# Patient Record
Sex: Male | Born: 2000 | Race: Black or African American | Hispanic: No | Marital: Single | Smoking: Never smoker
Health system: Southern US, Community
[De-identification: ages and names within clinical notes are randomized; demographics above are authoritative.]

## PROBLEM LIST (undated history)

## (undated) DIAGNOSIS — H9192 Unspecified hearing loss, left ear: Secondary | ICD-10-CM

## (undated) DIAGNOSIS — F32A Depression, unspecified: Secondary | ICD-10-CM

## (undated) DIAGNOSIS — H9190 Unspecified hearing loss, unspecified ear: Secondary | ICD-10-CM

## (undated) DIAGNOSIS — Z8719 Personal history of other diseases of the digestive system: Secondary | ICD-10-CM

## (undated) DIAGNOSIS — F419 Anxiety disorder, unspecified: Secondary | ICD-10-CM

## (undated) DIAGNOSIS — K56609 Unspecified intestinal obstruction, unspecified as to partial versus complete obstruction: Secondary | ICD-10-CM

## (undated) DIAGNOSIS — J45909 Unspecified asthma, uncomplicated: Secondary | ICD-10-CM

## (undated) HISTORY — DX: Depression, unspecified: F32.A

## (undated) HISTORY — DX: Anxiety disorder, unspecified: F41.9

## (undated) HISTORY — DX: Unspecified hearing loss, unspecified ear: H91.90

## (undated) HISTORY — DX: Unspecified intestinal obstruction, unspecified as to partial versus complete obstruction: K56.609

## (undated) HISTORY — DX: Unspecified hearing loss, left ear: H91.92

## (undated) HISTORY — DX: Personal history of other diseases of the digestive system: Z87.19

## (undated) HISTORY — DX: Unspecified asthma, uncomplicated: J45.909

---
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
## 2021-03-11 ENCOUNTER — Emergency Department (HOSPITAL_COMMUNITY)
Admission: EM | Admit: 2021-03-11 | Discharge: 2021-03-12 | Disposition: A | Discharge status: Home / Self Care | Payer: 59 | Attending: Emergency Medicine | Admitting: Emergency Medicine

## 2021-03-11 DIAGNOSIS — N50812 Left testicular pain: Secondary | ICD-10-CM | POA: Diagnosis present

## 2021-03-11 DIAGNOSIS — Z20822 Contact with and (suspected) exposure to covid-19: Secondary | ICD-10-CM | POA: Diagnosis not present

## 2021-03-11 DIAGNOSIS — N50819 Testicular pain, unspecified: Secondary | ICD-10-CM

## 2021-03-11 DIAGNOSIS — I861 Scrotal varices: Secondary | ICD-10-CM

## 2021-03-11 DIAGNOSIS — R102 Pelvic and perineal pain: Secondary | ICD-10-CM | POA: Diagnosis not present

## 2021-03-12 ENCOUNTER — Emergency Department (HOSPITAL_COMMUNITY): Payer: 59

## 2021-03-12 DIAGNOSIS — N50812 Left testicular pain: Secondary | ICD-10-CM | POA: Diagnosis not present

## 2021-03-12 LAB — URINALYSIS, ROUTINE W REFLEX MICROSCOPIC
Bacteria, UA: NONE SEEN
Bilirubin Urine: NEGATIVE
Glucose, UA: NEGATIVE mg/dL
Hgb urine dipstick: NEGATIVE
Ketones, ur: NEGATIVE mg/dL
Nitrite: NEGATIVE
Protein, ur: NEGATIVE mg/dL
Specific Gravity, Urine: 1.028 (ref 1.005–1.030)
pH: 5 (ref 5.0–8.0)

## 2021-03-12 LAB — RESP PANEL BY RT-PCR (FLU A&B, COVID) ARPGX2
Influenza A by PCR: NEGATIVE
Influenza B by PCR: NEGATIVE
SARS Coronavirus 2 by RT PCR: NEGATIVE

## 2021-03-12 MED ORDER — MORPHINE SULFATE (PF) 4 MG/ML IV SOLN
4.0000 mg | Freq: Once | INTRAVENOUS | Status: AC
  Administered 2021-03-12: 4 mg via INTRAVENOUS
  Filled 2021-03-12: qty 1

## 2021-03-12 MED ORDER — OXYCODONE-ACETAMINOPHEN 5-325 MG PO TABS
1.0000 | ORAL_TABLET | Freq: Once | ORAL | Status: DC
  Filled 2021-03-12: qty 1

## 2021-03-12 MED ORDER — DIPHENHYDRAMINE HCL 50 MG/ML IJ SOLN
25.0000 mg | Freq: Once | INTRAMUSCULAR | Status: AC
  Administered 2021-03-12: 25 mg via INTRAVENOUS
  Filled 2021-03-12: qty 1

## 2021-03-12 NOTE — ED Notes (Signed)
Patient is back from CT.

## 2021-03-12 NOTE — ED Notes (Signed)
Ultrasound at bedside. Patient states he began itching after having the morphine. MD is aware.

## 2021-03-12 NOTE — Discharge Instructions (Signed)
Your evaluation did not show the cause for your pain.  There is no sign of testicular torsion.  There is a varicocele on the left side.  This is a collection of dilated veins.  Please follow-up with the urologist.  If pain comes back, you may take ibuprofen or naproxen.  If this does not give you sufficient relief, then return to the emergency department.

## 2021-03-12 NOTE — ED Provider Notes (Signed)
MSE was initiated and I personally evaluated the patient and placed orders (if any) at  12:00 AM on Mar 12, 2021.  Left testicle pain Sudden onset at 11:00 pm  Today's Vitals   03/11/21 2354  BP: (!) 118/91  Pulse: 65  Resp: 16  Temp: 98.3 F (36.8 C)  TempSrc: Oral  SpO2: 96%  Weight: 77.1 kg  Height: 6\' 2"  (1.88 m)   Body mass index is 21.83 kg/m.  Appears uncomfortable  The patient appears stable so that the remainder of the MSE may be completed by another provider.   , PA-C 03/12/21 0000    03/14/21, MD 03/12/21 [phone]

## 2021-03-12 NOTE — ED Provider Notes (Signed)
[facility]ERGENCY DEPT Provider Note   CSN: [id number] Arrival date & time: 03/11/21  2345     History Chief complaint: Left testicle pain  David Cuevas is a 20 y.o. male.  The history is provided by the patient.  He complains of sudden onset of severe pain in his left testicle at about 10 PM.  Pain is sharp and worse with any movement.  Nothing makes it better.  He denies any nausea or vomiting.  He denies fever or chills.  There is some radiation of pain to the suprapubic area but not to the flank.  He has not taken anything for pain.  Pain is rated at 10/10.   Past Medical History:  Diagnosis Date  . Deafness in left ear   . Hx SBO     Patient Active Problem List   Diagnosis Date Noted  . Hx SBO   . Deafness in left ear     No past surgical history on file.     No family history on file.  Social History   Tobacco Use  . Smoking status: Never Smoker  . Smokeless tobacco: Never Used  Substance Use Topics  . Alcohol use: No  . Drug use: No    Home Medications Prior to Admission medications   Not on File    Allergies    Patient has no known allergies.  Review of Systems   Review of Systems  All other systems reviewed and are negative.   Physical Exam Updated Vital Signs BP (!) 118/91 (BP Location: Right Arm)   Pulse 65   Temp 98.3 F (36.8 C) (Oral)   Resp 16   Ht 6\' 2"  (1.88 m)   Wt 77.1 kg   SpO2 96%   BMI 21.83 kg/m   Physical Exam Vitals and nursing note reviewed.   20 year old male, appears uncomfortable, but is in no acute distress. Vital signs are normal. Oxygen saturation is 96%, which is normal. Head is normocephalic and atraumatic. PERRLA, EOMI. Oropharynx is clear. Neck is nontender and supple without adenopathy or JVD. Back is nontender and there is no CVA tenderness. Lungs are clear without rales, wheezes, or rhonchi. Chest is nontender. Heart has regular rate and rhythm without murmur. Abdomen is  soft, flat, nontender without masses or hepatosplenomegaly and peristalsis is normoactive. Genitalia: Uncircumcised penis.  Testes are descended.  Left testicle is markedly tender but is not enlarged or indurated and is not high riding or transverse in position. Extremities have no cyanosis or edema, full range of motion is present. Skin is warm and dry without rash. Neurologic: Mental status is normal, cranial nerves are intact, there are no motor or sensory deficits.  ED Results / Procedures / Treatments   Labs (all labs ordered are listed, but only abnormal results are displayed) Labs Reviewed  URINALYSIS, ROUTINE W REFLEX MICROSCOPIC - Abnormal; Notable for the following components:      Result Value   Leukocytes,Ua TRACE (*)    All other components within normal limits  RESP PANEL BY RT-PCR (FLU A&B, COVID) [id number]   Radiology CT Renal Stone Study  Result Date: 03/12/2021 CLINICAL DATA:  Left testicular pain, left flank pain EXAM: CT ABDOMEN AND PELVIS WITHOUT CONTRAST TECHNIQUE: Multidetector CT imaging of the abdomen and pelvis was performed following the standard protocol without IV contrast. COMPARISON:  None. FINDINGS: Lower chest: No acute abnormality. Hepatobiliary: No focal liver abnormality is seen. No gallstones, gallbladder wall thickening, or biliary dilatation.  Pancreas: Unremarkable Spleen: A 3.7 cm cyst is seen within the upper pole of the spleen. The spleen is otherwise unremarkable. Adrenals/Urinary Tract: Adrenal glands are unremarkable. Kidneys are normal, without renal calculi, focal lesion, or hydronephrosis. Bladder is unremarkable. Stomach/Bowel: Stomach is within normal limits. Appendix appears normal. No evidence of bowel wall thickening, distention, or inflammatory changes. No free intraperitoneal gas or fluid. Vascular/Lymphatic: No significant vascular findings are present. No enlarged abdominal or pelvic lymph nodes. Reproductive: Prostate is unremarkable. Other:  No abdominal wall hernia. Musculoskeletal: No acute bone abnormality. No lytic or blastic bone lesion. IMPRESSION: No acute intra-abdominal pathology identified. No definite radiographic explanation for the patient's reported symptoms. 3.7 cm simple cyst within the spleen Electronically Signed   By: Helyn Numbers MD   On: 03/12/2021 03:08   US SCROTUM W/DOPPLER  Result Date: 03/12/2021 CLINICAL DATA:  Left scrotal pain EXAM: SCROTAL ULTRASOUND DOPPLER ULTRASOUND OF THE TESTICLES TECHNIQUE: Complete ultrasound examination of the testicles, epididymis, and other scrotal structures was performed. Color and spectral Doppler ultrasound were also utilized to evaluate blood flow to the testicles. COMPARISON:  None. FINDINGS: Right testicle Measurements: 3.8 x 2.6 x 1.7 cm. No mass or microlithiasis visualized. Left testicle Measurements: 3.8 x 2.4 x 2.1 cm. No mass or microlithiasis visualized. Right epididymis:  Normal in size and appearance. Left epididymis:  Normal in size and appearance. Hydrocele:  None visualized. Varicocele:  Left-sided varicocele. Pulsed Doppler interrogation of both testes demonstrates normal low resistance arterial and venous waveforms bilaterally. IMPRESSION: 1. Left-sided varicocele. 2. Otherwise unremarkable ultrasound of the bilateral testicles and scrotum. No evidence of testicular mass or torsion. Electronically Signed   By: Maudry Mayhew MD   On: 03/12/2021 01:25    Procedures Procedures   Medications Ordered in ED Medications  oxyCODONE-acetaminophen (PERCOCET/ROXICET) 5-325 MG per tablet 1 tablet (has no administration in time range)  morphine 4 MG/ML injection 4 mg (has no administration in time range)    ED Course  I have reviewed the triage vital signs and the nursing notes.  Pertinent labs & imaging results that were available during my care of the patient were reviewed by me and considered in my medical decision making (see chart for details).   MDM  Rules/Calculators/A&P                         Sudden onset of left testicular pain concerning for testicular torsion.  Exam is not suggestive epididymitis.  He is being sent for scrotal ultrasound and Doppler.  Old records are reviewed, he has no relevant past visits.  Scrotal ultrasound shows no evidence of torsion.  Incidental finding of left varicocele which does not account for his pain.  No evidence of epididymitis.  He had excellent relief of pain with a single dose of morphine.  Will check urinalysis and renal stone protocol CT scan to evaluate for possible other causes of testicular pain.  CT scan shows no acute process.  Urinalysis is unremarkable.  Patient has not had recurrence of his pain.  He is felt to be safe for discharge.  Cause for pain is unclear.  He is referred to urology for follow-up.  Advised to return if pain recurs.  Final Clinical Impression(s) / ED Diagnoses Final diagnoses:  Pelvic pain  Testicle pain    Rx / DC Orders ED Discharge Orders    None       Dione Booze, MD 03/12/21 [phone]

## 2021-12-14 ENCOUNTER — Ambulatory Visit (INDEPENDENT_AMBULATORY_CARE_PROVIDER_SITE_OTHER): Payer: 59 | Admitting: Family Medicine

## 2021-12-14 ENCOUNTER — Encounter: Payer: Self-pay | Admitting: Family Medicine

## 2021-12-14 ENCOUNTER — Other Ambulatory Visit: Payer: Self-pay

## 2021-12-14 VITALS — BP 122/60 | HR 88 | Temp 98.6°F | Ht 74.0 in | Wt 155.4 lb

## 2021-12-14 DIAGNOSIS — R310 Gross hematuria: Secondary | ICD-10-CM | POA: Diagnosis not present

## 2021-12-14 DIAGNOSIS — G8929 Other chronic pain: Secondary | ICD-10-CM | POA: Diagnosis not present

## 2021-12-14 DIAGNOSIS — H9192 Unspecified hearing loss, left ear: Secondary | ICD-10-CM | POA: Diagnosis not present

## 2021-12-14 DIAGNOSIS — R519 Headache, unspecified: Secondary | ICD-10-CM | POA: Diagnosis not present

## 2021-12-14 DIAGNOSIS — Z8719 Personal history of other diseases of the digestive system: Secondary | ICD-10-CM

## 2021-12-14 DIAGNOSIS — Z832 Family history of diseases of the blood and blood-forming organs and certain disorders involving the immune mechanism: Secondary | ICD-10-CM

## 2021-12-14 DIAGNOSIS — M545 Low back pain, unspecified: Secondary | ICD-10-CM

## 2021-12-14 LAB — LIPID PANEL
Cholesterol: 146 mg/dL (ref 0–200)
HDL: 55.2 mg/dL (ref >39.00)
LDL Cholesterol: 72 mg/dL (ref 0–99)
NonHDL: 91.15
Total CHOL/HDL Ratio: 3
Triglycerides: 94 mg/dL (ref 0.0–149.0)
VLDL: 18.8 mg/dL (ref 0.0–40.0)

## 2021-12-14 LAB — CBC WITH DIFFERENTIAL/PLATELET
Basophils Absolute: 0 10*3/uL (ref 0.0–0.1)
Basophils Relative: 0.6 % (ref 0.0–3.0)
Eosinophils Absolute: 0 10*3/uL (ref 0.0–0.7)
Eosinophils Relative: 1 % (ref 0.0–5.0)
HCT: 40.5 % (ref 39.0–52.0)
Hemoglobin: 13.6 g/dL (ref 13.0–17.0)
Lymphocytes Relative: 45.1 % (ref 12.0–46.0)
Lymphs Abs: 1.9 10*3/uL (ref 0.7–4.0)
MCHC: 33.7 g/dL (ref 30.0–36.0)
MCV: 94.2 fl (ref 78.0–100.0)
Monocytes Absolute: 0.2 10*3/uL (ref 0.1–1.0)
Monocytes Relative: 5.6 % (ref 3.0–12.0)
Neutro Abs: 2 10*3/uL (ref 1.4–7.7)
Neutrophils Relative %: 47.7 % (ref 43.0–77.0)
Platelets: 308 10*3/uL (ref 150.0–400.0)
RBC: 4.3 Mil/uL (ref 4.22–5.81)
RDW: 12.8 % (ref 11.5–14.6)
WBC: 4.2 10*3/uL — ABNORMAL LOW (ref 4.5–10.5)

## 2021-12-14 LAB — COMPREHENSIVE METABOLIC PANEL
ALT: 16 U/L (ref 0–53)
AST: 19 U/L (ref 0–37)
Albumin: 4.6 g/dL (ref 3.5–5.2)
Alkaline Phosphatase: 85 U/L (ref 39–117)
BUN: 11 mg/dL (ref 6–23)
CO2: 33 mEq/L — ABNORMAL HIGH (ref 19–32)
Calcium: 9.7 mg/dL (ref 8.4–10.5)
Chloride: 103 mEq/L (ref 96–112)
Creatinine, Ser: 0.8 mg/dL (ref 0.40–1.50)
GFR: 127.85 mL/min (ref >60.00)
Glucose, Bld: 95 mg/dL (ref 70–99)
Potassium: 4.2 mEq/L (ref 3.5–5.1)
Sodium: 139 mEq/L (ref 135–145)
Total Bilirubin: 0.4 mg/dL (ref 0.2–1.2)
Total Protein: 6.9 g/dL (ref 6.0–8.3)

## 2021-12-14 LAB — URINALYSIS, ROUTINE W REFLEX MICROSCOPIC
Bilirubin Urine: NEGATIVE
Hgb urine dipstick: NEGATIVE
Leukocytes,Ua: NEGATIVE
Nitrite: NEGATIVE
RBC / HPF: NONE SEEN (ref 0–?)
Specific Gravity, Urine: 1.025 (ref 1.000–1.030)
Total Protein, Urine: NEGATIVE
Urine Glucose: NEGATIVE
Urobilinogen, UA: 0.2 (ref 0.0–1.0)
pH: 6.5 (ref 5.0–8.0)

## 2021-12-14 LAB — IBC + FERRITIN
Ferritin: 22.3 ng/mL (ref 22.0–322.0)
Iron: 54 ug/dL (ref 42–165)
Saturation Ratios: 12.6 % — ABNORMAL LOW (ref 20.0–50.0)
TIBC: 427 ug/dL (ref 250.0–450.0)
Transferrin: 305 mg/dL (ref 212.0–360.0)

## 2021-12-14 LAB — SEDIMENTATION RATE: Sed Rate: 7 mm/hr (ref 0–15)

## 2021-12-14 LAB — TSH: TSH: 2.09 u[IU]/mL (ref 0.35–5.50)

## 2021-12-14 LAB — C-REACTIVE PROTEIN: CRP: <1 mg/dL (ref 0.5–20.0)

## 2021-12-14 LAB — HEMOGLOBIN A1C: Hgb A1c MFr Bld: 5.7 % (ref 4.6–6.5)

## 2021-12-14 NOTE — Progress Notes (Signed)
New Patient Office Visit  Subjective:  Patient ID: David Cuevas, male    DOB: [birth year]  Age: 21 y.o. MRN: [id number]  CC:  Chief Complaint  Patient presents with   Establish Care    Bleeding from your penis that started 2 months ago, has happened again a year ago Pain in left lower back pain off and on for about 1 month Back pain when standing up for too long    Headache    Having daily headaches, takes Aleve     HPI-here w/mom- David Cuevas presents for new pt  H/o SBO since 3-4 yo-occurs about yearly-goes to ER and gets CT and resolves.  Dad has had mult surgeries. Occ stomach pain-long time-lower abd.  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   Hasn't seen aud since adulthood-used to see regularly.  Intermitt hematuria x 1 yr-pt never went to urol.had CT in May for other reasons.  No pain. No trauma.  Aleve new but intermitt ibuprofen LBP when stands for long time for 1 mo.irritation/aches.  no rad. No n/t.  Job is physical.  Hot shower not help. Taking NSAID's Daily HA for months. Sleeps ok. No falling asleep easily.  No neck pain.  HA achey-"solid ringing in head". Can see "stars" during HA. +photo. No phono.  No n/v. Worsens as day progresses.  Sleep and meds help.  Caffeine none Lost 50#-stress, not eating.  No appetite. Occurred Over period of 6 months.-relationship.  Wt stabilized 4 mo ago.    Some cousins w/sickle trait/anemia,    Past Medical History:  Diagnosis Date   Anxiety    Asthma    Bowel obstruction (HCC)    hx of bowel obstruction   Deafness in left ear    Depression    Hearing loss    left ear, partially right   Hx SBO     History reviewed. No pertinent surgical history.  Family History  Problem Relation Age of Onset   Bowel Disease Father        bowel obstruction   Hypertension Father     Social History   Socioeconomic History   Marital status: Single    Spouse name: Not on file   Number of children: Not on file    Years of education: Not on file   Highest education level: Not on file  Occupational History   Not on file  Tobacco Use   Smoking status: Never   Smokeless tobacco: Never  Vaping Use   Vaping Use: Never used  Substance and Sexual Activity   Alcohol use: No   Drug use: No   Sexual activity: Not Currently  Other Topics Concern   Not on file  Social History Narrative   Patient is in sixth grade in a [employer] school in [place].  He is on the [employer] honor roll. He plays basketball. Not currently sexually active.   Social Determinants of Health   Financial Resource Strain: Not on file  Food Insecurity: Not on file  Transportation Needs: Not on file  Physical Activity: Not on file  Stress: Not on file  Social Connections: Not on file  Intimate Partner Violence: Not on file    ROS   Objective:   Today's Vitals: BP 122/60    Pulse 88    Temp 98.6 F (37 C) (Temporal)    Ht 6\' 2"  (1.88 m)    Wt 155 lb 6 oz (70.5 kg)  SpO2 98%    BMI 19.95 kg/m   Gen: WDWN NAD AAM HEENT: NCAT, conjunctiva not injected, sclera nonicteric TM WNL B, OP moist, no exudates  NECK:  supple, no thyromegaly, no nodes, no carotid bruits CARDIAC: RRR, S1S2+, no murmur. DP 2+B LUNGS: CTAB. No wheezes ABDOMEN:  BS+, soft, NTND, No HSM, no masses EXT:  no edema MSK: no gross abnormalities.  NEURO: A&O x3.  CN II-XII intact.  PSYCH: normal mood. Good eye contact   Assessment & Plan:   Problem List Items Addressed This Visit       Nervous and Auditory   Deafness in left ear   Relevant Orders   Ambulatory referral to Audiology     Other   Hx SBO   Other Visit Diagnoses     Gross hematuria    -  Primary   Relevant Orders   Comprehensive metabolic panel (Completed)   CBC with Differential/Platelet (Completed)   IBC + Ferritin (Completed)   Sedimentation rate (Completed)   C-reactive protein (Completed)   Urinalysis, Routine w reflex microscopic (Completed)   Ambulatory referral to Urology    Chronic nonintractable headache, unspecified headache type       Relevant Medications   Naproxen Sodium (ALEVE PO)   Other Relevant Orders   Hemoglobin A1c (Completed)   Lipid panel (Completed)   TSH (Completed)   Comprehensive metabolic panel (Completed)   CBC with Differential/Platelet (Completed)   IBC + Ferritin (Completed)   Sedimentation rate (Completed)   C-reactive protein (Completed)   Urinalysis, Routine w reflex microscopic (Completed)   Acute bilateral low back pain without sciatica       Relevant Medications   Naproxen Sodium (ALEVE PO)   Family history of sickle cell trait       Relevant Orders   Sickle cell screen     1.  Painless hematuria-intermittent-CT in May was negative for pathology.  Check UA, CBC, CMP.  Refer to urology 2.  Chronic deafness left ear.  Some in right.  Has not seen an audiologist in a couple of years.  Will refer 3.  Headache-not sure if migraine, anemia, stress, other.  Will check labs.  Do stretches, eat better (has very poor diet) drink plenty of water.  Keep a log. 4.  Low back pain-stretches, check general labs.  Consider physical therapy. 5.  Family history of anemia/sickle cell-check labs.  Check sickle screen. 6.  History of multiple small bowel obstructions-dad has similar problems.  Will check basic labs.  May need to see GI.  Follow up in 2 months  Outpatient Encounter Medications as of 12/14/2021  Medication Sig   Naproxen Sodium (ALEVE PO) Take 220 mg by mouth. 220 mg x 2 daily   No facility-administered encounter medications on file as of 12/14/2021.    Follow-up: Return in about 2 months (around 02/11/2022) for headache.   Wellington Hampshire, MD

## 2021-12-14 NOTE — Patient Instructions (Signed)
Welcome to [facility]! It was a pleasure meeting you today.  As discussed, Please schedule a 2 month follow up visit today.  Take magnesium 400mg /day  PLEASE NOTE:  If you had any LAB tests please let know if you have not heard back within a few days. You may see your results on MyChart before we have a chance to review them but we will give you a call once they are reviewed by [place]. If we ordered any REFERRALS today, please let us know if you have not heard from their office within the next week.  Let us know through MyChart if you are needing REFILLS, or have your pharmacy send [place] the request. You can also use MyChart to communicate with me or any office staff.  Please try these tips to maintain a healthy lifestyle:  Eat most of your calories during the day when you are active. Eliminate processed foods including packaged sweets (pies, cakes, cookies), reduce intake of potatoes, white bread, white pasta, and white rice. Look for whole grain options, oat flour or almond flour.  Each meal should contain half fruits/vegetables, one quarter protein, and one quarter carbs (no bigger than a computer mouse).  Cut down on sweet beverages. This includes juice, soda, and sweet tea. Also watch fruit intake, though this is a healthier sweet option, it still contains natural sugar! Limit to 3 servings daily.  Drink at least 1 glass of water with each meal and aim for at least 8 glasses per day  Exercise at least 150 minutes every week.

## 2021-12-15 LAB — SICKLE CELL SCREEN: Sickle Solubility Test - HGBRFX: NEGATIVE

## 2021-12-17 ENCOUNTER — Encounter: Payer: Self-pay | Admitting: *Deleted

## 2022-01-06 ENCOUNTER — Ambulatory Visit: Payer: 59 | Admitting: Family Medicine

## 2022-02-12 ENCOUNTER — Ambulatory Visit: Payer: 59 | Admitting: Family Medicine

## 2022-02-23 ENCOUNTER — Ambulatory Visit: Payer: 59 | Admitting: Family Medicine

## 2022-07-19 ENCOUNTER — Encounter: Payer: Self-pay | Admitting: *Deleted

## 2022-10-07 ENCOUNTER — Encounter: Payer: Self-pay | Admitting: *Deleted

## 2022-11-11 IMAGING — US US SCROTUM W/ DOPPLER COMPLETE
1 series · 15 of 25 positions shown · non-contrast
Comparison: None.

CLINICAL DATA: Left scrotal pain

EXAM:
SCROTAL ULTRASOUND
DOPPLER ULTRASOUND OF THE TESTICLES
TECHNIQUE: Complete ultrasound examination of the testicles, epididymis, and
other scrotal structures was performed. Color and spectral Doppler
ultrasound were also utilized to evaluate blood flow to the
testicles.

[Series 1: us art/ven flow abd pelv doppl mc & wl · 15 of 44 slices shown]
[im 1/44]
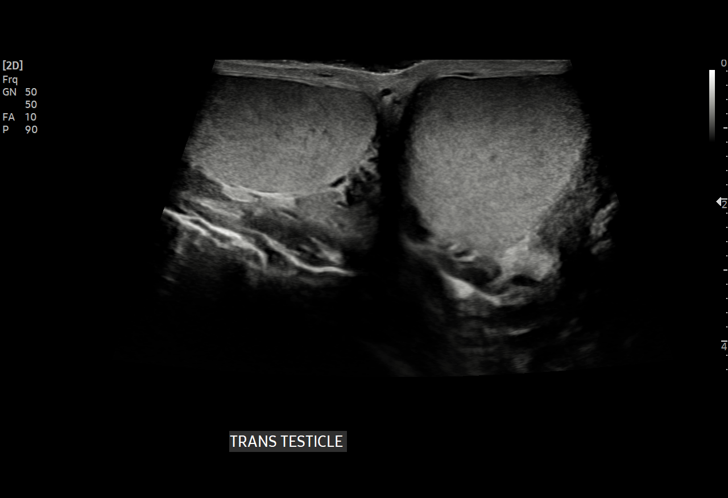
[im 4/44]
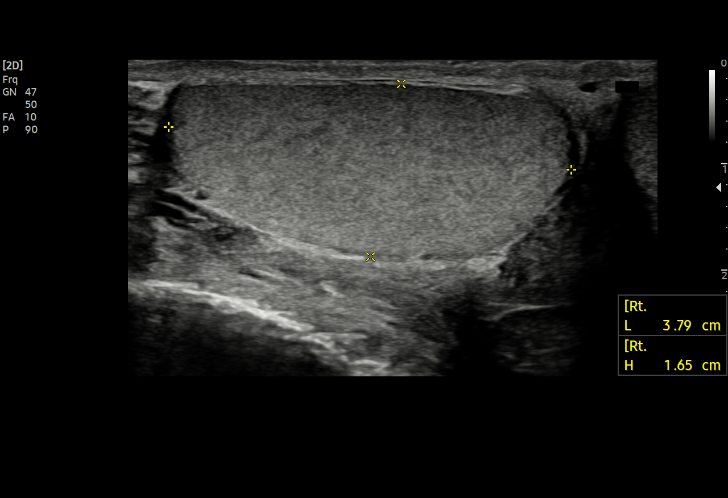
[im 8/44]
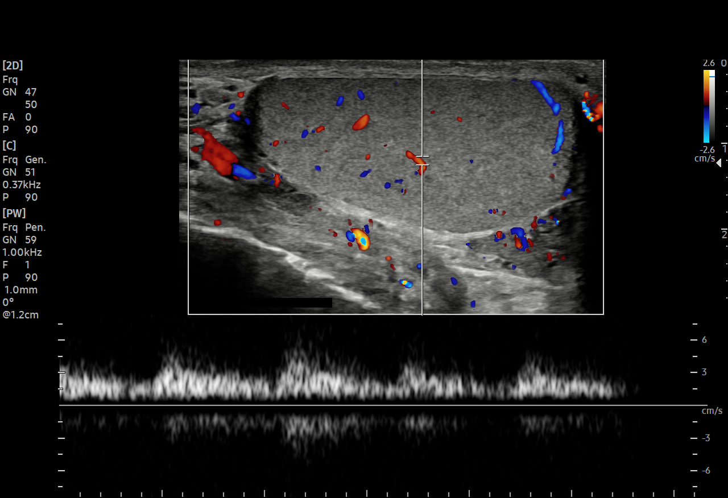
[im 9/44]
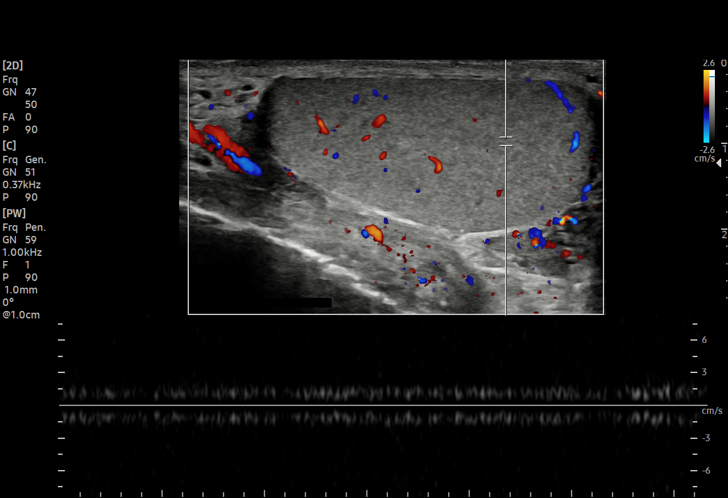
[im 13/44]
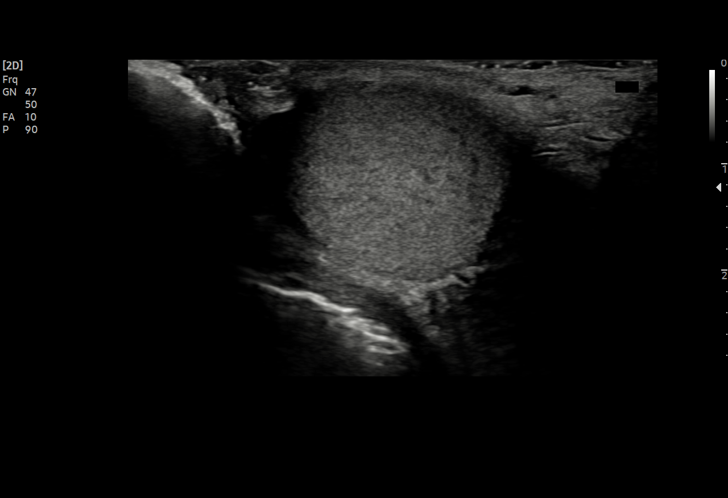
[im 17/44]
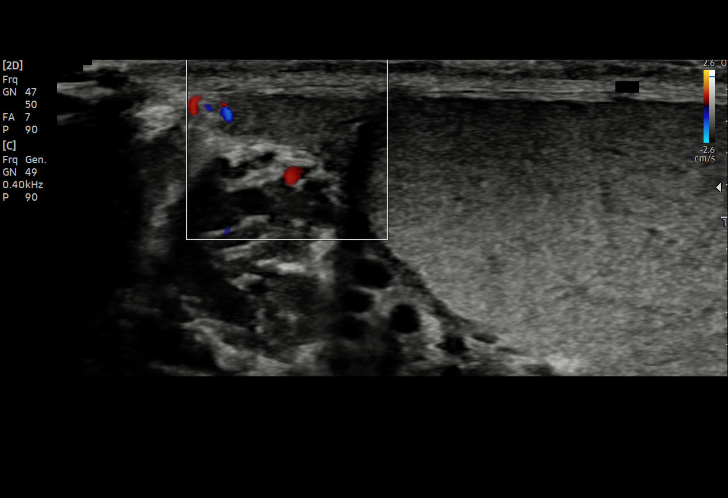
[im 18/44]
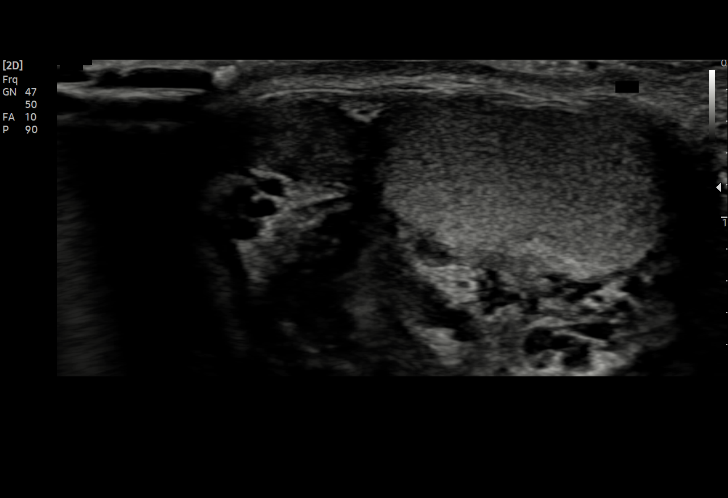
[im 22/44]
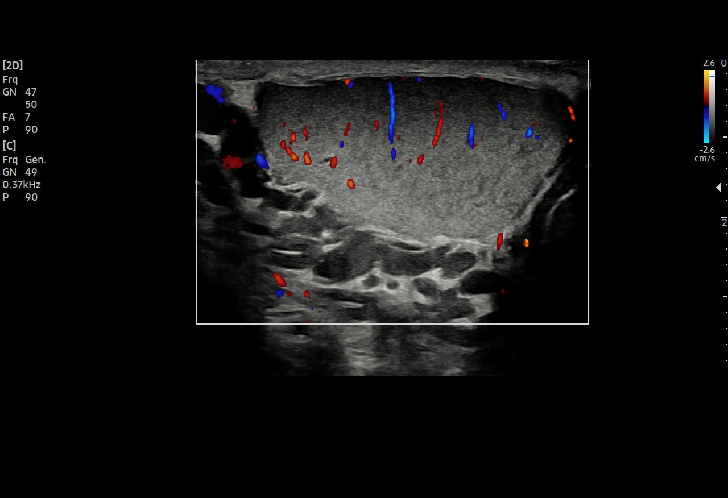
[im 26/44]
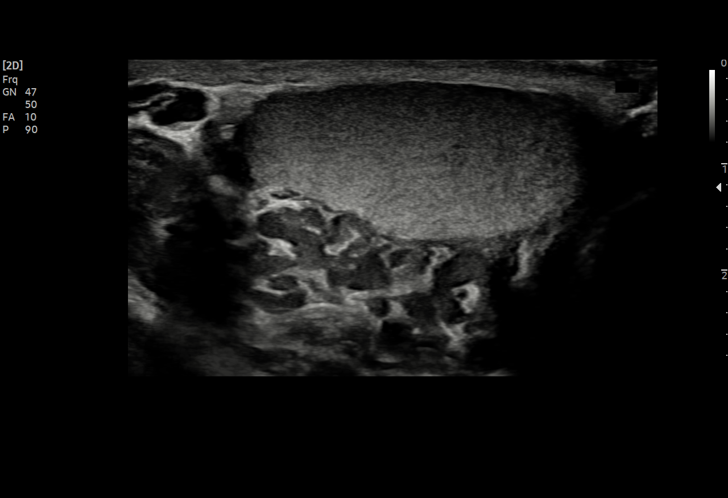
[im 27/44]
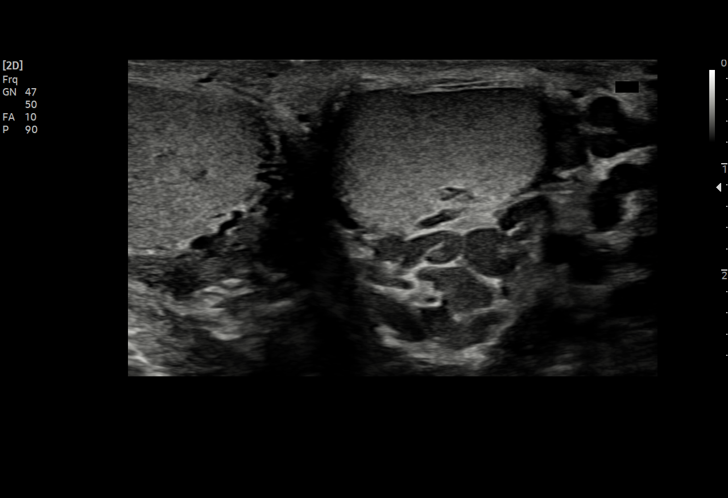
[im 31/44]
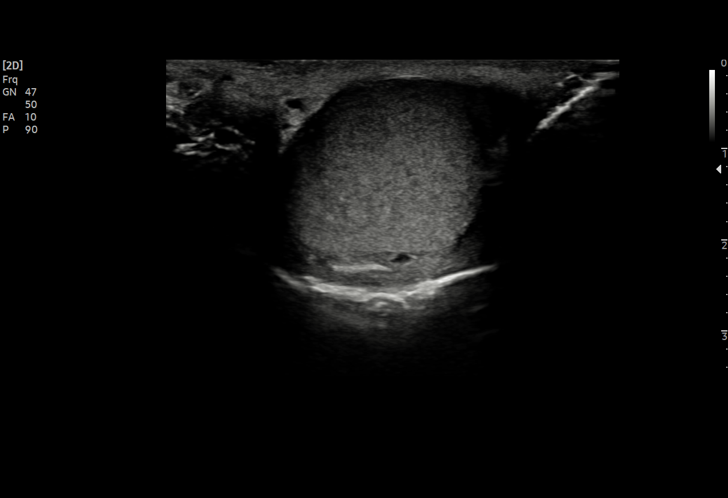
[im 35/44]
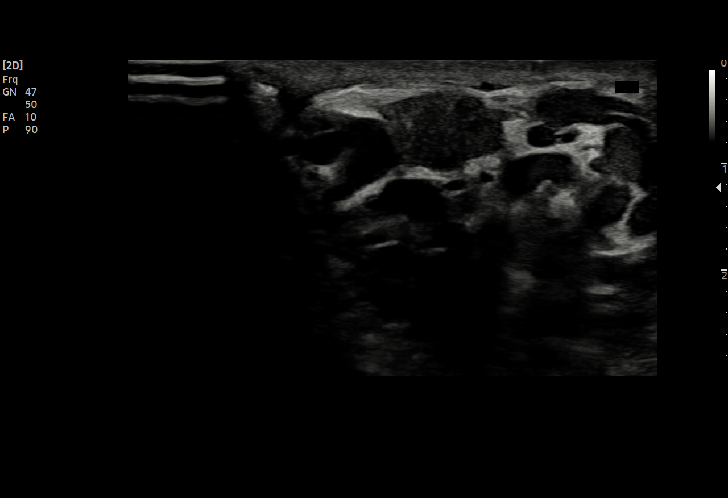
[im 36/44]
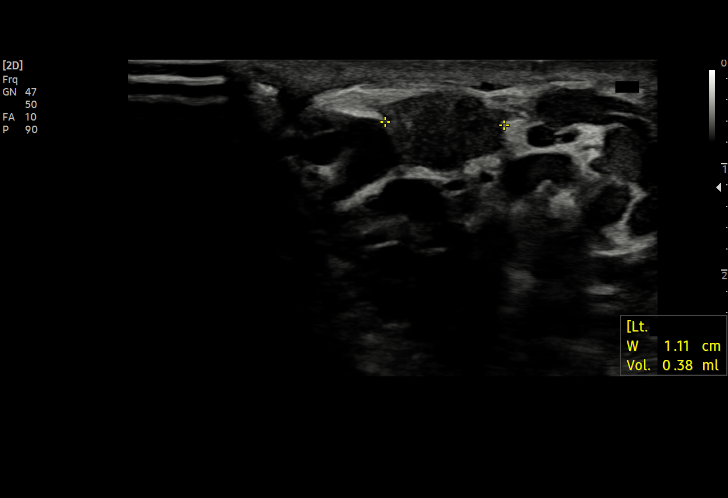
[im 40/44]
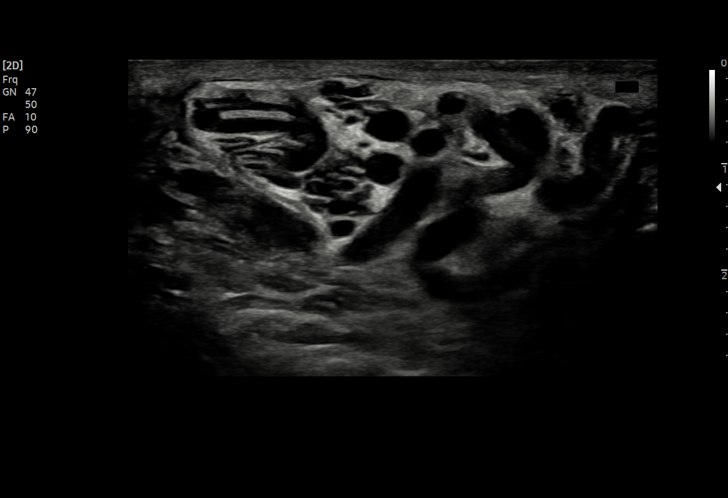
[im 44/44]
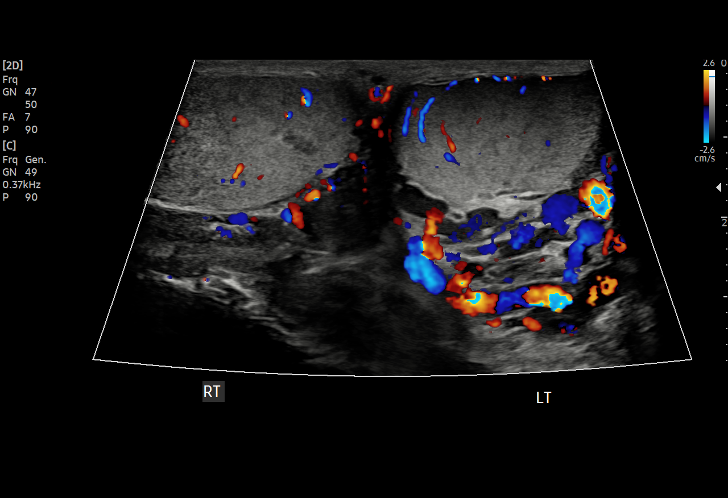

[15 of 25 positions shown; findings below may reference images not displayed]

FINDINGS: Right testicle

Measurements: 3.8 x 2.6 x 1.7 cm. No mass or microlithiasis
visualized.

Left testicle

Measurements: 3.8 x 2.4 x 2.1 cm. No mass or microlithiasis
visualized.

Right epididymis:  Normal in size and appearance.

Left epididymis:  Normal in size and appearance.

Hydrocele:  None visualized.

Varicocele:  Left-sided varicocele.

Pulsed Doppler interrogation of both testes demonstrates normal low
resistance arterial and venous waveforms bilaterally.
IMPRESSION: 1. Left-sided varicocele.
2. Otherwise unremarkable ultrasound of the bilateral testicles and
scrotum. No evidence of testicular mass or torsion.

## 2022-11-11 IMAGING — CT CT RENAL STONE PROTOCOL
2 of 4 series · 16 of 46 positions shown, 18 images · non-contrast
Comparison: None.

CLINICAL DATA: Left testicular pain, left flank pain

EXAM:
CT ABDOMEN AND PELVIS WITHOUT CONTRAST
TECHNIQUE: Multidetector CT imaging of the abdomen and pelvis was performed
following the standard protocol without IV contrast.

[Series 2: axial st · axial · 0.71mm/px · z∈[+1228,+1618]mm · 13 of 88 slices shown, 15 images]
[im 5/88  soft-tissue]
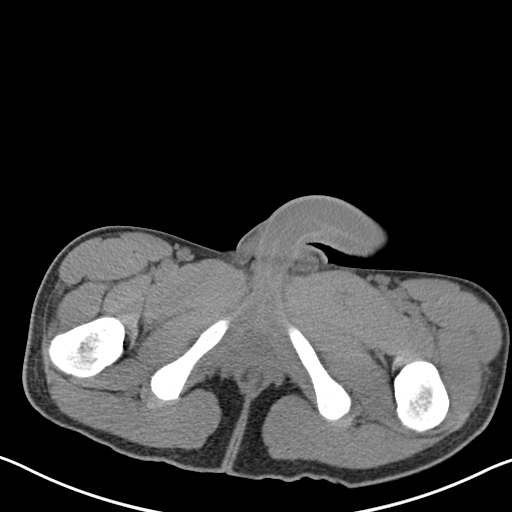
[im 5/88  bone]
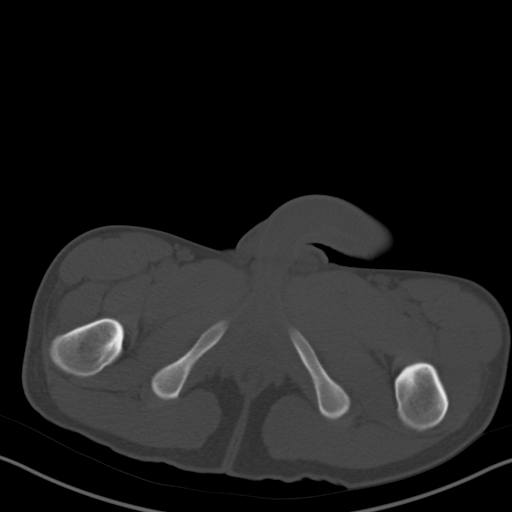
[im 14/88  soft-tissue]
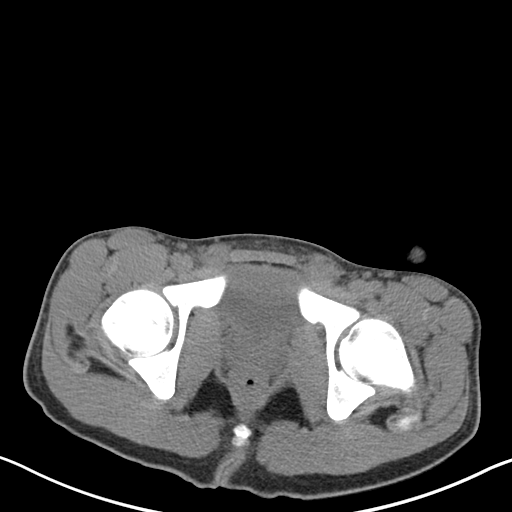
[im 18/88  soft-tissue]
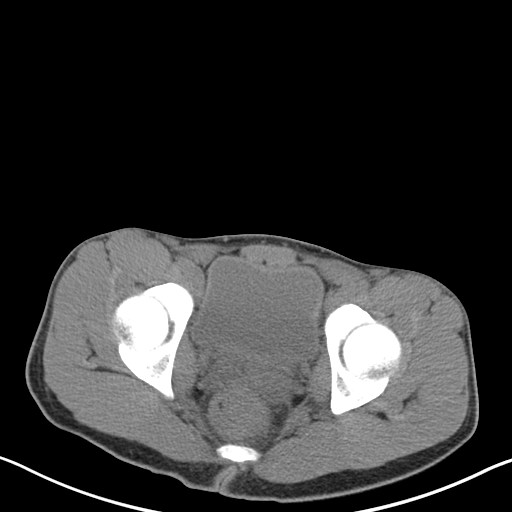
[im 27/88  soft-tissue]
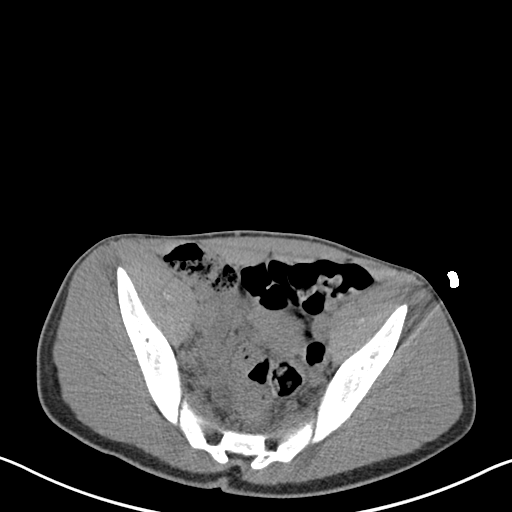
[im 31/88  soft-tissue]
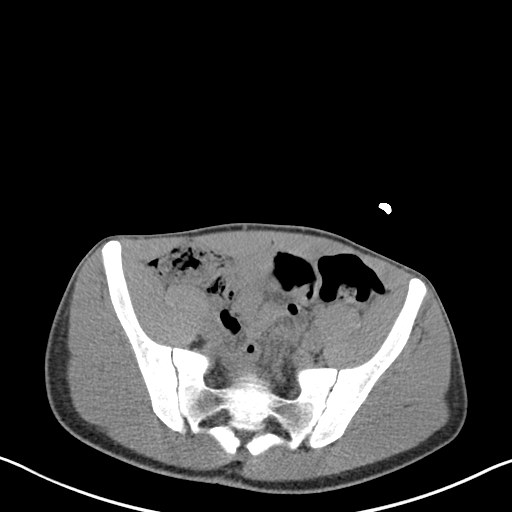
[im 40/88  soft-tissue]
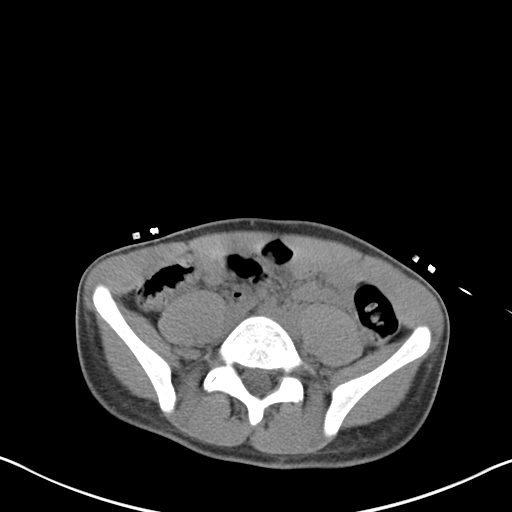
[im 44/88  soft-tissue]
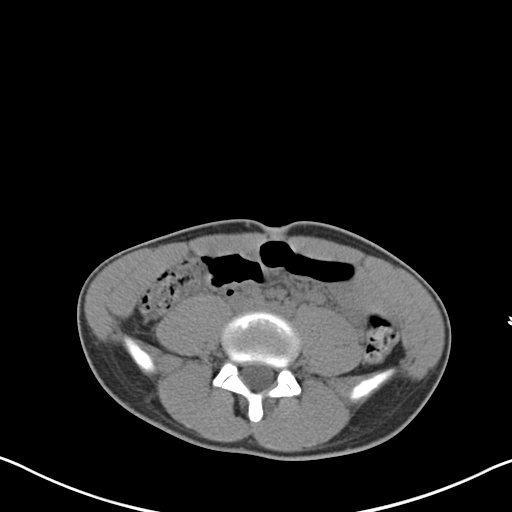
[im 48/88  soft-tissue]
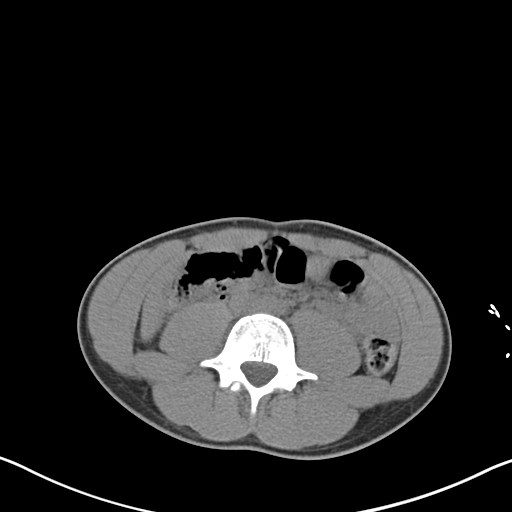
[im 57/88  soft-tissue]
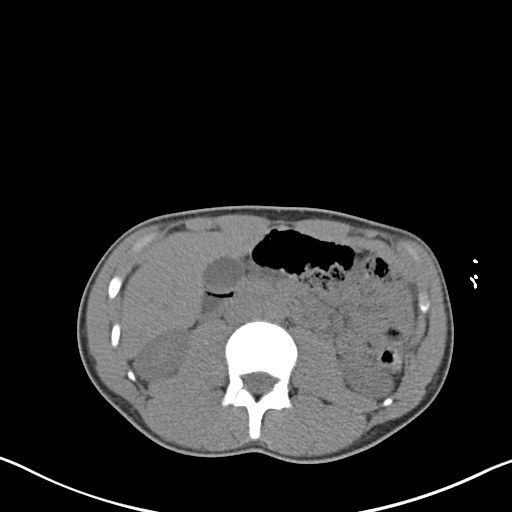
[im 57/88  bone]
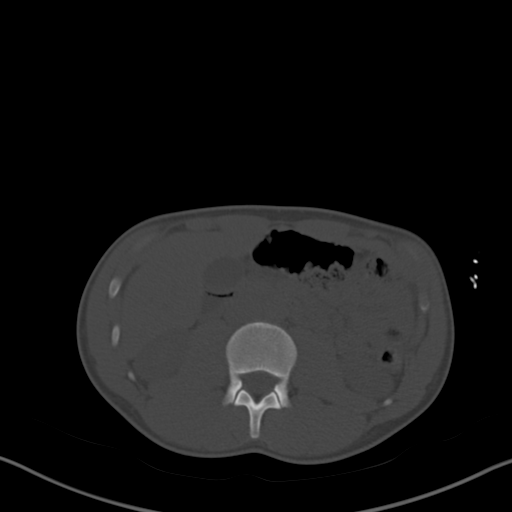
[im 61/88  soft-tissue]
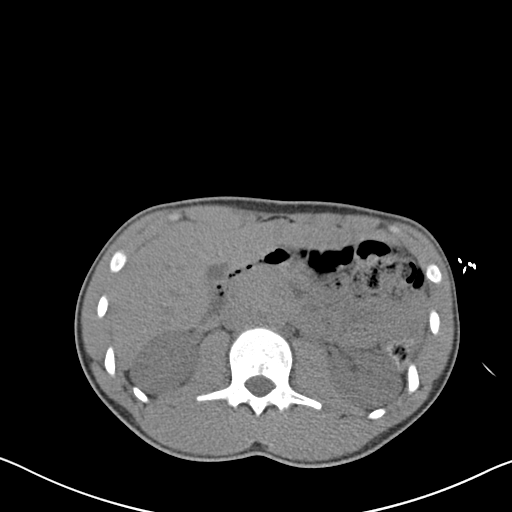
[im 70/88  soft-tissue]
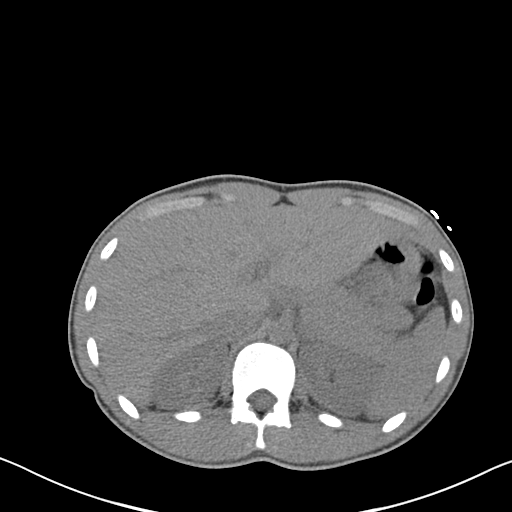
[im 74/88  soft-tissue]
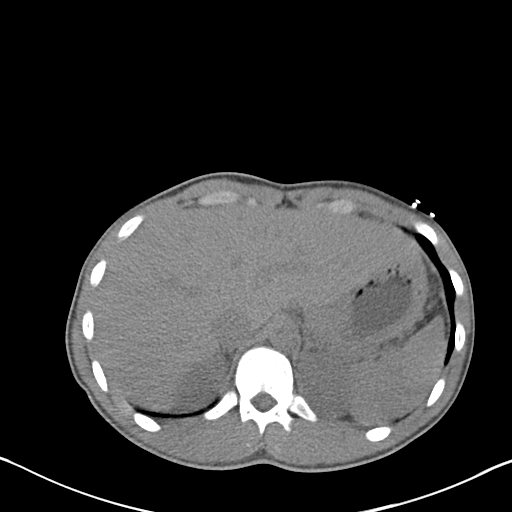
[im 83/88  soft-tissue]
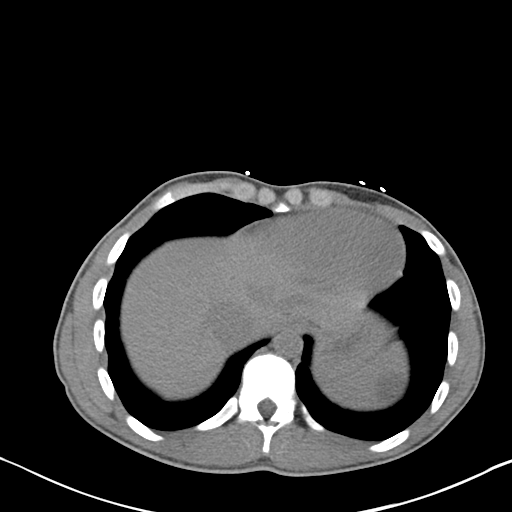

[Series 4: coronal · coronal · 0.62mm/px · 3 of 111 slices shown]
[im 37/111  soft-tissue]
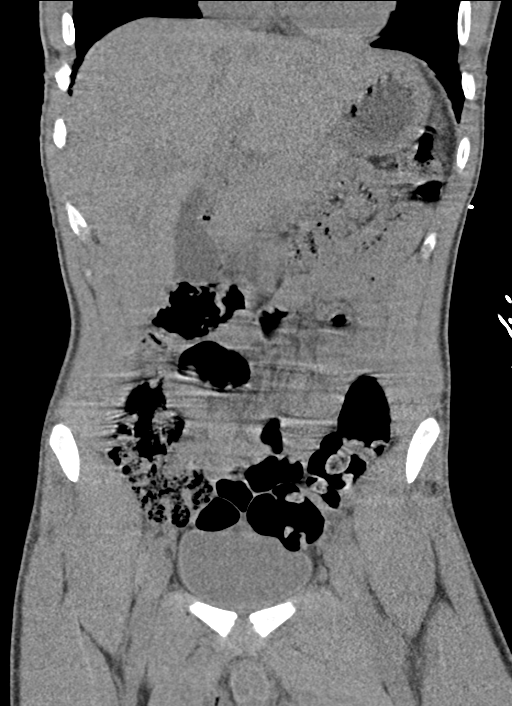
[im 49/111  soft-tissue]
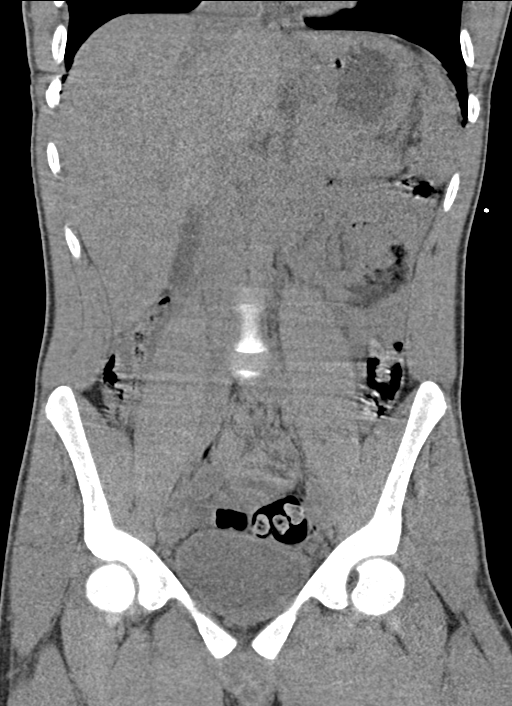
[im 62/111  soft-tissue]
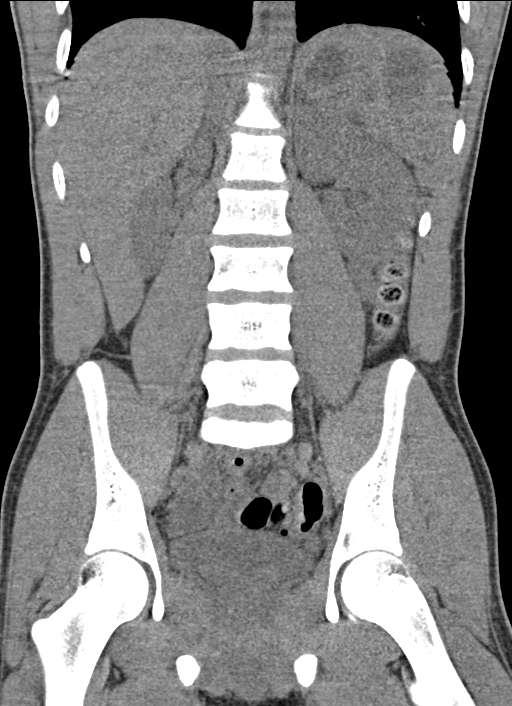

[16 of 46 positions shown; findings below may reference images not displayed]

FINDINGS: Lower chest: No acute abnormality.

Hepatobiliary: No focal liver abnormality is seen. No gallstones,
gallbladder wall thickening, or biliary dilatation.

Pancreas: Unremarkable

Spleen: A 3.7 cm cyst is seen within the upper pole of the spleen.
The spleen is otherwise unremarkable.

Adrenals/Urinary Tract: Adrenal glands are unremarkable. Kidneys are
normal, without renal calculi, focal lesion, or hydronephrosis.
Bladder is unremarkable.

Stomach/Bowel: Stomach is within normal limits. Appendix appears
normal. No evidence of bowel wall thickening, distention, or
inflammatory changes. No free intraperitoneal gas or fluid.

Vascular/Lymphatic: No significant vascular findings are present. No
enlarged abdominal or pelvic lymph nodes.

Reproductive: Prostate is unremarkable.

Other: No abdominal wall hernia.

Musculoskeletal: No acute bone abnormality. No lytic or blastic bone
lesion.
IMPRESSION: No acute intra-abdominal pathology identified. No definite
radiographic explanation for the patient's reported symptoms.

3.7 cm simple cyst within the spleen

## 2024-05-17 ENCOUNTER — Other Ambulatory Visit

## 2024-05-17 ENCOUNTER — Other Ambulatory Visit: Payer: Self-pay | Admitting: Family Medicine

## 2024-05-17 DIAGNOSIS — R1031 Right lower quadrant pain: Secondary | ICD-10-CM

## 2024-05-17 DIAGNOSIS — N50819 Testicular pain, unspecified: Secondary | ICD-10-CM

## 2024-07-08 ENCOUNTER — Other Ambulatory Visit: Payer: Self-pay

## 2024-07-08 ENCOUNTER — Emergency Department (HOSPITAL_COMMUNITY)
Admission: EM | Admit: 2024-07-08 | Discharge: 2024-07-08 | Disposition: A | Discharge status: Home / Self Care | Attending: Emergency Medicine | Admitting: Emergency Medicine

## 2024-07-08 DIAGNOSIS — X58XXXA Exposure to other specified factors, initial encounter: Secondary | ICD-10-CM | POA: Insufficient documentation

## 2024-07-08 DIAGNOSIS — Z23 Encounter for immunization: Secondary | ICD-10-CM | POA: Insufficient documentation

## 2024-07-08 DIAGNOSIS — S61411A Laceration without foreign body of right hand, initial encounter: Secondary | ICD-10-CM | POA: Diagnosis not present

## 2024-07-08 DIAGNOSIS — S6991XA Unspecified injury of right wrist, hand and finger(s), initial encounter: Secondary | ICD-10-CM | POA: Diagnosis present

## 2024-07-08 DIAGNOSIS — J45909 Unspecified asthma, uncomplicated: Secondary | ICD-10-CM | POA: Insufficient documentation

## 2024-07-08 MED ORDER — TETANUS-DIPHTH-ACELL PERTUSSIS 5-2.5-18.5 LF-MCG/0.5 IM SUSY
0.5000 mL | PREFILLED_SYRINGE | Freq: Once | INTRAMUSCULAR | Status: AC
  Administered 2024-07-08: 0.5 mL via INTRAMUSCULAR
  Filled 2024-07-08: qty 0.5

## 2024-07-08 MED ORDER — LIDOCAINE HCL (PF) 1 % IJ SOLN
5.0000 mL | Freq: Once | INTRAMUSCULAR | Status: AC
  Administered 2024-07-08: 5 mL
  Filled 2024-07-08: qty 30

## 2024-07-08 NOTE — ED Triage Notes (Signed)
 Patient to ED by POV with c/o right hand laceration. Per patient he was walking and walked into a sign casing laceration to right hand near 3rd and 4th digit.

## 2024-07-08 NOTE — ED Provider Notes (Signed)
 [place] EMERGENCY DEPARTMENT AT [facility] Provider Note  CSN: [id number] Arrival date & time: 07/08/24 9276  Chief Complaint(s) Laceration  HPI David Cuevas is a 23 y.o. male history of partial deafness presenting with right hand laceration.  Reports laceration to dorsal right hand.  Walked into a sign.  Denies other injuries or complaints.  Denies any numbness or tingling.  Denies pain to the hand other than site of laceration   Past Medical History Past Medical History:  Diagnosis Date   Anxiety    Asthma    Bowel obstruction (HCC)    hx of bowel obstruction   Deafness in left ear    Depression    Hearing loss    left ear, partially right   Hx SBO    Patient Active Problem List   Diagnosis Date Noted   Hx SBO    Deafness in left ear    Home Medication(s) Prior to Admission medications   Medication Sig Start Date End Date Taking? Authorizing Provider  Naproxen Sodium (ALEVE PO) Take 220 mg by mouth. 220 mg x 2 daily    [provider]                                                                                                                                    Past Surgical History No past surgical history on file. Family History Family History  Problem Relation Age of Onset   Bowel Disease Father        bowel obstruction   Hypertension Father     Social History Social History   Tobacco Use   Smoking status: Never   Smokeless tobacco: Never  Vaping Use   Vaping status: Never Used  Substance Use Topics   Alcohol use: No   Drug use: Yes    Types: Marijuana   Allergies Patient has no known allergies.  Review of Systems Review of Systems  All other systems reviewed and are negative.   Physical Exam Vital Signs  I have reviewed the triage vital signs BP (!) 101/52 (BP Location: Left Arm)   Pulse (!) 50   Temp 97.8 F (36.6 C) (Oral)   Resp 14   Ht 6' 2 (1.88 m)   Wt 65.8 kg   SpO2 100%   BMI 18.62 kg/m  Physical  Exam Vitals and nursing note reviewed.  Constitutional:      General: He is not in acute distress.    Appearance: Normal appearance.  HENT:     Head: Normocephalic and atraumatic.     Mouth/Throat:     Mouth: Mucous membranes are moist.  Eyes:     Conjunctiva/sclera: Conjunctivae normal.  Cardiovascular:     Rate and Rhythm: Normal rate.  Pulmonary:     Effort: Pulmonary effort is normal. No respiratory distress.  Abdominal:     General: Abdomen is flat.  Skin:    General:  Skin is warm and dry.     Capillary Refill: Capillary refill takes less than 2 seconds.     Comments: Approximately 2 cm laceration on dorsal right hand, no underlying bony tenderness.  No exposed tendon or bone  Neurological:     General: No focal deficit present.     Mental Status: He is alert. Mental status is at baseline.  Psychiatric:        Mood and Affect: Mood normal.        Behavior: Behavior normal.     ED Results and Treatments Labs (all labs ordered are listed, but only abnormal results are displayed) Labs Reviewed - No data to display                                                                                                                        Radiology No results found.  Pertinent labs & imaging results that were available during my care of the patient were reviewed by me and considered in my medical decision making (see MDM for details).  Medications Ordered in ED Medications  lidocaine  (PF) (XYLOCAINE ) 1 % injection 5 mL (5 mLs Other Given by Other 07/08/24 0904)  Tdap (BOOSTRIX) injection 0.5 mL (0.5 mLs Intramuscular Given 07/08/24 0855)                                                                                                                                     Procedures .Laceration Repair  Date/Time: 07/08/2024 9:39 AM  Performed by: Francesca Elsie CROME, MD Authorized by: Francesca Elsie CROME, MD   Consent:    Consent obtained:  Verbal   Consent given by:   Patient   Risks, benefits, and alternatives were discussed: yes     Risks discussed:  Infection, need for additional repair, nerve damage, pain, vascular damage, poor wound healing, poor cosmetic result, retained foreign body and tendon damage   Alternatives discussed:  No treatment Universal protocol:    Patient identity confirmed:  Verbally with patient and arm band Anesthesia:    Anesthesia method:  Local infiltration   Local anesthetic:  Lidocaine  1% w/o epi Laceration details:    Location:  Hand   Hand location:  R hand, dorsum   Length (cm):  2 Exploration:    Wound exploration: wound explored through full range of motion and entire depth of wound visualized     Wound  extent: areolar tissue not violated, fascia not violated, no foreign body, no signs of injury, no nerve damage, no tendon damage, no underlying fracture and no vascular damage     Contaminated: no   Treatment:    Area cleansed with:  Saline   Amount of cleaning:  Standard   Irrigation solution:  Sterile saline   Debridement:  None   Undermining:  None   Scar revision: no   Skin repair:    Repair method:  Sutures   Suture size:  5-0   Suture material:  Prolene   Suture technique:  Simple interrupted Approximation:    Approximation:  Close Repair type:    Repair type:  Simple Post-procedure details:    Procedure completion:  Tolerated well, no immediate complications   (including critical care time)  Medical Decision Making / ED Course   MDM:  23 year old with laceration.  Laceration appears superficial.  Will repair after irrigation.  Patient unknown about tetanus and will update.  Anticipate discharge  Clinical Course as of 07/08/24 0940  Sun Jul 08, 2024  0940 Wound repaired. Will discharge patient to home. All questions answered. Patient comfortable with plan of discharge. Return precautions discussed with patient and specified on the after visit summary.  [WS]    Clinical Course User  Index [WS] Francesca Elsie CROME, MD     Additional history obtained: -Additional history obtained from friend   Medicines ordered and prescription drug management: Meds ordered this encounter  Medications   lidocaine  (PF) (XYLOCAINE ) 1 % injection 5 mL   Tdap (BOOSTRIX) injection 0.5 mL    -I have reviewed the patients home medicines and have made adjustments as needed  Reevaluation: After the interventions noted above, I reevaluated the patient and found that their symptoms have improved  Co morbidities that complicate the patient evaluation  Past Medical History:  Diagnosis Date   Anxiety    Asthma    Bowel obstruction (HCC)    hx of bowel obstruction   Deafness in left ear    Depression    Hearing loss    left ear, partially right   Hx SBO       Dispostion: Disposition decision including need for hospitalization was considered, and patient discharged from emergency department.    Final Clinical Impression(s) / ED Diagnoses Final diagnoses:  Laceration of right hand without foreign body, initial encounter     This chart was dictated using voice recognition software.  Despite best efforts to proofread,  errors can occur which can change the documentation meaning.    Francesca Elsie CROME, MD 07/08/24 [phone]

## 2024-07-08 NOTE — Discharge Instructions (Addendum)
 Please have your sutures removed in 7-10 days. Please return if you develop redness, swelling, pain, or any other concerning symptoms.
